# Patient Record
Sex: Male | Born: 1985 | Race: White | Hispanic: No | Marital: Single | Smoking: Current every day smoker
Health system: Southern US, Community
[De-identification: ages and names within clinical notes are randomized; demographics above are authoritative.]

## PROBLEM LIST (undated history)

## (undated) DIAGNOSIS — K219 Gastro-esophageal reflux disease without esophagitis: Secondary | ICD-10-CM

## (undated) HISTORY — PX: OTHER SURGICAL HISTORY: SHX169

---

## 2014-03-22 ENCOUNTER — Emergency Department (HOSPITAL_COMMUNITY): Payer: Self-pay

## 2014-03-22 ENCOUNTER — Encounter (HOSPITAL_COMMUNITY): Payer: Self-pay | Admitting: Emergency Medicine

## 2014-03-22 ENCOUNTER — Emergency Department (HOSPITAL_COMMUNITY)
Admission: EM | Admit: 2014-03-22 | Discharge: 2014-03-22 | Disposition: A | Discharge status: Home / Self Care | Payer: Self-pay | Attending: Emergency Medicine | Admitting: Emergency Medicine

## 2014-03-22 DIAGNOSIS — R635 Abnormal weight gain: Secondary | ICD-10-CM | POA: Insufficient documentation

## 2014-03-22 DIAGNOSIS — Z79899 Other long term (current) drug therapy: Secondary | ICD-10-CM | POA: Insufficient documentation

## 2014-03-22 DIAGNOSIS — F17200 Nicotine dependence, unspecified, uncomplicated: Secondary | ICD-10-CM | POA: Insufficient documentation

## 2014-03-22 DIAGNOSIS — F411 Generalized anxiety disorder: Secondary | ICD-10-CM | POA: Insufficient documentation

## 2014-03-22 DIAGNOSIS — R079 Chest pain, unspecified: Secondary | ICD-10-CM | POA: Insufficient documentation

## 2014-03-22 DIAGNOSIS — R42 Dizziness and giddiness: Secondary | ICD-10-CM | POA: Insufficient documentation

## 2014-03-22 DIAGNOSIS — R0602 Shortness of breath: Secondary | ICD-10-CM | POA: Insufficient documentation

## 2014-03-22 LAB — BASIC METABOLIC PANEL
ANION GAP: 14 (ref 5–15)
BUN: 13 mg/dL (ref 6–23)
CHLORIDE: 104 meq/L (ref 96–112)
CO2: 21 meq/L (ref 19–32)
Calcium: 9.4 mg/dL (ref 8.4–10.5)
Creatinine, Ser: 0.98 mg/dL (ref 0.50–1.35)
GFR calc Af Amer: >90 mL/min (ref >90)
GFR calc non Af Amer: >90 mL/min (ref >90)
Glucose, Bld: 122 mg/dL — ABNORMAL HIGH (ref 70–99)
Potassium: 4.1 mEq/L (ref 3.7–5.3)
Sodium: 139 mEq/L (ref 137–147)

## 2014-03-22 LAB — URINALYSIS, ROUTINE W REFLEX MICROSCOPIC
BILIRUBIN URINE: NEGATIVE
Glucose, UA: NEGATIVE mg/dL
HGB URINE DIPSTICK: NEGATIVE
Ketones, ur: NEGATIVE mg/dL
Leukocytes, UA: NEGATIVE
Nitrite: NEGATIVE
PROTEIN: NEGATIVE mg/dL
Specific Gravity, Urine: 1.025 (ref 1.005–1.030)
UROBILINOGEN UA: 0.2 mg/dL (ref 0.0–1.0)
pH: 8 (ref 5.0–8.0)

## 2014-03-22 LAB — CBC
HEMATOCRIT: 47.7 % (ref 39.0–52.0)
Hemoglobin: 16.8 g/dL (ref 13.0–17.0)
MCH: 31.6 pg (ref 26.0–34.0)
MCHC: 35.2 g/dL (ref 30.0–36.0)
MCV: 89.8 fL (ref 78.0–100.0)
Platelets: 216 10*3/uL (ref 150–400)
RBC: 5.31 MIL/uL (ref 4.22–5.81)
RDW: 12.9 % (ref 11.5–15.5)
WBC: 7.1 10*3/uL (ref 4.0–10.5)

## 2014-03-22 LAB — I-STAT TROPONIN, ED: Troponin i, poc: 0 ng/mL (ref 0.00–0.08)

## 2014-03-22 LAB — PRO B NATRIURETIC PEPTIDE: PRO B NATRI PEPTIDE: 25.7 pg/mL (ref 0–125)

## 2014-03-22 LAB — D-DIMER, QUANTITATIVE: D-Dimer, Quant: 0.34 ug/mL-FEU (ref 0.00–0.48)

## 2014-03-22 LAB — TSH: TSH: 1.27 u[IU]/mL (ref 0.350–4.500)

## 2014-03-22 NOTE — ED Provider Notes (Signed)
CSN: [id number]     Arrival date & time 03/22/14  0115 History   First MD Initiated Contact with Patient 03/22/14 0210     Chief Complaint  Patient presents with  . Chest Pain  . Palpitations     (Consider location/radiation/quality/duration/timing/severity/associated sxs/prior Treatment) HPI  This a 28 year old male who presents with multiple complaints. Patient reports that he had onset of anterior chest pain approximately 2 hours prior to arrival. He reports the sharp and worse with movement. Currently it is 0/10 but was 8/10. He reports associated shortness of breath and dizziness. Patient also reports lower extremity swelling that started on Friday. It has since improved. No known history of blood clots, recent travel, recent hospitalization. The recent fevers or illnesses. Patient does report weight gain over the last several months that has been unintentional. He also reports "tingling all over."  History reviewed. No pertinent past medical history. History reviewed. No pertinent past surgical history. No family history on file. History  Substance Use Topics  . Smoking status: Current Every Day Smoker -- 1.00 packs/day    Types: Cigarettes  . Smokeless tobacco: Not on file  . Alcohol Use: Yes     Comment: occ    Review of Systems  Constitutional: Negative.  Negative for fever.  Respiratory: Positive for chest tightness and shortness of breath.   Cardiovascular: Positive for chest pain and leg swelling.  Gastrointestinal: Negative.  Negative for nausea, vomiting, abdominal pain and diarrhea.  Genitourinary: Negative.  Negative for dysuria.  Musculoskeletal: Negative for back pain.  Skin: Negative for rash.  Neurological: Positive for dizziness. Negative for weakness, numbness and headaches.       Tingling  All other systems reviewed and are negative.     Allergies  Review of patient's allergies indicates no known allergies.  Home Medications   Prior to Admission  medications   Medication Sig Start Date End Date Taking? Authorizing Provider  calcium carbonate (TUMS - DOSED IN MG ELEMENTAL CALCIUM) 500 MG chewable tablet Chew 1 tablet by mouth daily.   Yes Historical Provider, MD  esomeprazole (NEXIUM) 40 MG capsule Take 40 mg by mouth daily at 12 noon.   Yes Historical Provider, MD  ibuprofen (ADVIL,MOTRIN) 200 MG tablet Take 200 mg by mouth every 6 (six) hours as needed for headache.   Yes Historical Provider, MD   BP 136/69  Pulse 75  Temp(Src) 99 F (37.2 C) (Oral)  Resp 16  Ht  (1.93 m)  Wt 265 lb (120.203 kg)  BMI 32.27 kg/m2  SpO2 96% Physical Exam  Nursing note and vitals reviewed. Constitutional: He is oriented to person, place, and time. He appears well-developed and well-nourished.  HENT:  Head: Normocephalic and atraumatic.  Eyes: Pupils are equal, round, and reactive to light.  Neck: Neck supple.  Cardiovascular: Normal rate, regular rhythm and normal heart sounds.   No murmur heard. Pulmonary/Chest: Effort normal and breath sounds normal. No respiratory distress. He has no wheezes. He exhibits no tenderness.  Abdominal: Soft. Bowel sounds are normal. There is no tenderness. There is no rebound.  Musculoskeletal: He exhibits no edema.  Lymphadenopathy:    He has no cervical adenopathy.  Neurological: He is alert and oriented to person, place, and time.  Skin: Skin is warm and dry.  Psychiatric:  anxious    ED Course  Procedures (including critical care time) Labs Review Labs Reviewed  BASIC METABOLIC PANEL - Abnormal; Notable for the following:    Glucose, Bld 122 (*)  All other components within normal limits  URINALYSIS, ROUTINE W REFLEX MICROSCOPIC - Abnormal; Notable for the following:    APPearance CLOUDY (*)    All other components within normal limits  CBC  PRO B NATRIURETIC PEPTIDE  TSH  D-DIMER, QUANTITATIVE  I-STAT TROPOININ, ED    Imaging Review Dg Chest 2 View  03/22/2014   CLINICAL DATA:   Chest pain and palpitations.  EXAM: CHEST  2 VIEW  COMPARISON:  None.  FINDINGS: Normal heart size and mediastinal contours. No acute infiltrate or edema. No effusion or pneumothorax. No acute osseous findings.  IMPRESSION: No active cardiopulmonary disease.   Electronically Signed   By: Tiburcio Pea M.D.   On: 03/22/2014 02:07     EKG Interpretation   Date/Time:  Wednesday March 22 2014 01:18:54 EDT Ventricular Rate:  106 PR Interval:  163 QRS Duration: 93 QT Interval:  346 QTC Calculation: 459 R Axis:   61 Text Interpretation:  Sinus tachycardia No prior for comparison Confirmed  by HORTON  MD, COURTNEY ([id number]) on 03/22/2014 4:31:14 AM      MDM   Final diagnoses:  Chest pain, unspecified chest pain type  Weight gain   Patient presents with multiple complaints including chest pain.  Some complaints appear chronic in nature including lower extremity edema and weight gain as well as "tingling all over." He is nontoxic-appearing on exam. Vital signs notable for pulse of 100 and blood pressure of 174/78.  Patient does not have a primary doctor. Patient's physical exam is benign.  Workup including troponin and d-dimer are reassuring. BNP is not elevated. No evidence of proteinuria. TSH is within normal limits.  Chest x-ray has no evidence of pneumothorax or pneumonia.  Patient has no evidence of PE. Doubt ACS.  Discussed results with the patient. Will set up with [facility]up.  After history, exam, and medical workup I feel the patient has been appropriately medically screened and is safe for discharge home. Pertinent diagnoses were discussed with the patient. Patient was given return precautions.      Shon Baton, MD 03/24/14 [phone]

## 2014-03-22 NOTE — ED Notes (Signed)
PT c/o chest pain that began approx 2 hrs ago; pt c/o substernal chest pain described as tightness; pt c/o palpitations off and on and feeling like his heart is beating out of his chest; pt states that he has had 15lbs weight gain in the last month for no reason; pt c/o bilateral foot swelling off and on over the last week; pt also c/o intermittent numbness and tingling throughout his body over the last few days; denies any numbness or tingling at present.

## 2014-03-22 NOTE — Discharge Instructions (Signed)

## 2014-08-11 ENCOUNTER — Emergency Department (HOSPITAL_COMMUNITY): Payer: Self-pay

## 2014-08-11 ENCOUNTER — Encounter (HOSPITAL_COMMUNITY): Payer: Self-pay | Admitting: *Deleted

## 2014-08-11 ENCOUNTER — Emergency Department (HOSPITAL_COMMUNITY)
Admission: EM | Admit: 2014-08-11 | Discharge: 2014-08-12 | Disposition: A | Discharge status: Home / Self Care | Payer: Self-pay | Attending: Emergency Medicine | Admitting: Emergency Medicine

## 2014-08-11 DIAGNOSIS — K219 Gastro-esophageal reflux disease without esophagitis: Secondary | ICD-10-CM | POA: Insufficient documentation

## 2014-08-11 DIAGNOSIS — Z79899 Other long term (current) drug therapy: Secondary | ICD-10-CM | POA: Insufficient documentation

## 2014-08-11 DIAGNOSIS — R109 Unspecified abdominal pain: Secondary | ICD-10-CM

## 2014-08-11 DIAGNOSIS — Z87891 Personal history of nicotine dependence: Secondary | ICD-10-CM | POA: Insufficient documentation

## 2014-08-11 DIAGNOSIS — R1011 Right upper quadrant pain: Secondary | ICD-10-CM | POA: Insufficient documentation

## 2014-08-11 HISTORY — DX: Gastro-esophageal reflux disease without esophagitis: K21.9

## 2014-08-11 LAB — CBC WITH DIFFERENTIAL/PLATELET
Basophils Absolute: 0 10*3/uL (ref 0.0–0.1)
Basophils Relative: 0 % (ref 0–1)
EOS PCT: 4 % (ref 0–5)
Eosinophils Absolute: 0.3 10*3/uL (ref 0.0–0.7)
HCT: 49.6 % (ref 39.0–52.0)
Hemoglobin: 17.2 g/dL — ABNORMAL HIGH (ref 13.0–17.0)
Lymphocytes Relative: 23 % (ref 12–46)
Lymphs Abs: 1.7 10*3/uL (ref 0.7–4.0)
MCH: 31.1 pg (ref 26.0–34.0)
MCHC: 34.7 g/dL (ref 30.0–36.0)
MCV: 89.7 fL (ref 78.0–100.0)
MONO ABS: 0.5 10*3/uL (ref 0.1–1.0)
Monocytes Relative: 6 % (ref 3–12)
NEUTROS ABS: 5 10*3/uL (ref 1.7–7.7)
Neutrophils Relative %: 67 % (ref 43–77)
Platelets: 213 10*3/uL (ref 150–400)
RBC: 5.53 MIL/uL (ref 4.22–5.81)
RDW: 12.8 % (ref 11.5–15.5)
WBC: 7.5 10*3/uL (ref 4.0–10.5)

## 2014-08-11 LAB — COMPREHENSIVE METABOLIC PANEL
ALK PHOS: 55 U/L (ref 39–117)
ALT: 56 U/L — ABNORMAL HIGH (ref 0–53)
AST: 30 U/L (ref 0–37)
Albumin: 4.6 g/dL (ref 3.5–5.2)
Anion gap: 9 (ref 5–15)
BILIRUBIN TOTAL: 0.9 mg/dL (ref 0.3–1.2)
BUN: 12 mg/dL (ref 6–23)
CHLORIDE: 106 mmol/L (ref 96–112)
CO2: 24 mmol/L (ref 19–32)
Calcium: 9.3 mg/dL (ref 8.4–10.5)
Creatinine, Ser: 0.91 mg/dL (ref 0.50–1.35)
GLUCOSE: 91 mg/dL (ref 70–99)
POTASSIUM: 4.1 mmol/L (ref 3.5–5.1)
SODIUM: 139 mmol/L (ref 135–145)
Total Protein: 7.9 g/dL (ref 6.0–8.3)

## 2014-08-11 LAB — LIPASE, BLOOD: Lipase: 23 U/L (ref 11–59)

## 2014-08-11 MED ORDER — GI COCKTAIL ~~LOC~~
30.0000 mL | Freq: Once | ORAL | Status: AC
  Administered 2014-08-11: 30 mL via ORAL
  Filled 2014-08-11: qty 30

## 2014-08-11 MED ORDER — PANTOPRAZOLE SODIUM 40 MG PO TBEC
40.0000 mg | DELAYED_RELEASE_TABLET | Freq: Once | ORAL | Status: AC
  Administered 2014-08-11: 40 mg via ORAL
  Filled 2014-08-11: qty 1

## 2014-08-11 NOTE — ED Notes (Signed)
US at bedside

## 2014-08-11 NOTE — ED Provider Notes (Signed)
CSN: [id number]     Arrival date & time 08/11/14  2000 History   First MD Initiated Contact with Patient 08/11/14 2108     Chief Complaint  Patient presents with  . Abdominal Pain     (Consider location/radiation/quality/duration/timing/severity/associated sxs/prior Treatment) HPI  Patient with PMH of GERD and former smoker (quit date 08/05/14; 1ppd) who presents today complaining of  sharp burning RUQ pain x 5d that has now become diffuse abdominal pain.  He states that today he felt like his intestines were "spasming" for approximately 30 seconds and is now experiencing diffuse abdominal pain.  The RUQ pain is described as constant.  He currently takes Nexium everyday and states that when he misses a dose his reflux is really terrible.  Associated symptoms include melena and periods of diaphoresis during sharp pains.  Denies N/V/D/C, fever, dysuria, SOB, CP, hematochezia.  He is not sure if the abdominal pain is associated with food, but does endorse >10 cups of coffee a day, loves [place] food, and denies recent EtOH use.  Past Medical History  Diagnosis Date  . GERD (gastroesophageal reflux disease) x 10 years    Past Surgical History  Procedure Laterality Date  . Eustacian tubes Bilateral     ears   History reviewed. No pertinent family history. History  Substance Use Topics  . Smoking status: Former Smoker -- 1.00 packs/day    Types: Cigarettes    Quit date: 08/05/2014  . Smokeless tobacco: Not on file  . Alcohol Use: Yes     Comment: occ/ not in the last 90 days    Review of Systems Negative unless otherwise stated in HPI.    Allergies  Review of patient's allergies indicates no known allergies.  Home Medications   Prior to Admission medications   Medication Sig Start Date End Date Taking? Authorizing Provider  calcium carbonate (TUMS - DOSED IN MG ELEMENTAL CALCIUM) 500 MG chewable tablet Chew 1 tablet by mouth daily as needed for indigestion (indigestion).    Yes  Historical Provider, MD  esomeprazole (NEXIUM) 40 MG capsule Take 40 mg by mouth daily at 12 noon.   Yes Historical Provider, MD  ibuprofen (ADVIL,MOTRIN) 200 MG tablet Take 400 mg by mouth every 6 (six) hours as needed for headache or moderate pain (pain).    Yes Historical Provider, MD   BP 136/84 mmHg  Pulse 74  Temp(Src) 97.9 F (36.6 C)  Resp 20  Ht  (1.93 m)  Wt 290 lb (131.543 kg)  BMI 35.31 kg/m2  SpO2 100% Physical Exam  Constitutional: He is oriented to person, place, and time. He appears well-developed and well-nourished. No distress.  HENT:  Head: Normocephalic and atraumatic.  Eyes: Conjunctivae and EOM are normal. Pupils are equal, round, and reactive to light.  Neck: Normal range of motion. Neck supple.  Cardiovascular: Normal rate, regular rhythm and normal heart sounds.   No murmur heard. Pulmonary/Chest: Effort normal and breath sounds normal. He has no wheezes. He has no rales.  Abdominal: Soft. Normal appearance and bowel sounds are normal. He exhibits no distension. There is tenderness in the right upper quadrant. There is no rigidity, no rebound, no guarding, no tenderness at McBurney's point and negative Murphy's sign.  Neurological: He is alert and oriented to person, place, and time. He exhibits normal muscle tone. Coordination normal.  Skin: Skin is warm and dry. No rash noted. He is not diaphoretic.  Nursing note and vitals reviewed.   ED Course  Procedures (  including critical care time) Labs Review Labs Reviewed  CBC WITH DIFFERENTIAL/PLATELET - Abnormal; Notable for the following:    Hemoglobin 17.2 (*)    All other components within normal limits  COMPREHENSIVE METABOLIC PANEL - Abnormal; Notable for the following:    ALT 56 (*)    All other components within normal limits  LIPASE, BLOOD  POC OCCULT BLOOD, ED    Imaging Review [place] Abdomen Complete  08/11/2014   CLINICAL DATA:  Abdominal pain.   Initial encounter.  EXAM: ULTRASOUND ABDOMEN  COMPLETE  COMPARISON:  None.  FINDINGS: Gallbladder: No gallstones or wall thickening visualized. No sonographic Murphy sign noted.  Common bile duct: Diameter: Could not be visualized due to excessive bowel gas.  Liver: No focal lesion identified. Increased echogenicity. No intrahepatic biliary duct dilatation.  IVC: No abnormality visualized.  Pancreas: Visualized portion unremarkable.  Spleen: Size and appearance within normal limits.  Right Kidney: Length: 13.5 cm. Echogenicity within normal limits. No mass or hydronephrosis visualized.  Left Kidney: Length: 14.1 cm. Echogenicity within normal limits. No mass or hydronephrosis visualized.  Abdominal aorta: No aneurysm visualized.  Other findings: None.  IMPRESSION: Hepatic steatosis. Negative gallbladder. No strong evidence of biliary obstruction.   Electronically Signed   By: Odessa FlemingH  Hall M.D.   On: 08/11/2014 23:31   Ct Abdomen Pelvis W Contrast  08/12/2014   CLINICAL DATA:  29 year old male 5 days of abdominal pain. Right-sided pain. Initial encounter.  EXAM: CT ABDOMEN AND PELVIS WITH CONTRAST  TECHNIQUE: Multidetector CT imaging of the abdomen and pelvis was performed using the standard protocol following bolus administration of intravenous contrast.  CONTRAST:  125mL OMNIPAQUE IOHEXOL 300 MG/ML  SOLN  COMPARISON:  Abdomen ultrasound 08/11/2014  FINDINGS: Negative lung bases. Degenerative changes lower lumbar spine. Congenital short pedicle distant. No acute osseous abnormality identified.  No pelvic free fluid. No large bowel inflammation. Normal appendix. Node I was held up. Negative stomach and duodenum.  Decreased density in the liver. No intra or extrahepatic biliary ductal dilatation. Negative gallbladder, spleen, pancreas, adrenal glands, kidneys, in abdominal free of could.  Portal venous system and major arterial structures appear patent. No lymphadenopathy. Unremarkable bladder.  IMPRESSION: Normal appendix.  Negative CT Abdomen and Pelvis.    Electronically Signed   By: Odessa FlemingH  Hall M.D.   On: 08/12/2014 00:38     MDM   Final diagnoses:  None    Patient presents complaining of 5d of burning abdominal pain with PMH significant for GERD that is treated with Nexium.  He recently quit smoking (08/05/2013) and smoked 1 ppd.  Drinks >10 cups of coffee/day and likes spicy foods.  Labs are unremarkable.  Given GI cocktail in the ED with improvement of symptoms.  Prescribed sucralfate and ranitidine and discharged with a GI followup.  May return as needed or if symptoms worsen.     Carlyle DollyChristopher W Mashal Slavick, PA-C 08/12/14 2213  Toy BakerAnthony T Allen, MD 08/13/14 [phone]

## 2014-08-11 NOTE — ED Notes (Signed)
Pt refused ordered POC occult.

## 2014-08-11 NOTE — ED Notes (Signed)
Pt came into the ED today with c/o abdominal pain x 5days. Pain was initially burning/sharp wright side abd but now pain/burning is all over. N/V denied

## 2014-08-12 MED ORDER — HYDROCODONE-ACETAMINOPHEN 5-325 MG PO TABS: 1.0000 | ORAL_TABLET | Freq: Four times a day (QID) | ORAL | Status: AC | PRN

## 2014-08-12 MED ORDER — SUCRALFATE 1 G PO TABS: 1.0000 g | ORAL_TABLET | Freq: Three times a day (TID) | ORAL | Status: DC

## 2014-08-12 MED ORDER — FAMOTIDINE 20 MG PO TABS: 20.0000 mg | ORAL_TABLET | Freq: Two times a day (BID) | ORAL | Status: AC

## 2014-08-12 MED ORDER — IOHEXOL 300 MG/ML  SOLN
125.0000 mL | Freq: Once | INTRAMUSCULAR | Status: AC | PRN
  Administered 2014-08-12: 125 mL via INTRAVENOUS

## 2014-08-12 NOTE — Discharge Instructions (Signed)
Return here as needed.  Follow-up with GI, Dr. provided.  Slowly increase your fluid intake

## 2014-09-21 ENCOUNTER — Emergency Department (HOSPITAL_COMMUNITY)
Admission: EM | Admit: 2014-09-21 | Discharge: 2014-09-21 | Disposition: A | Discharge status: Home / Self Care | Payer: Self-pay | Attending: Emergency Medicine | Admitting: Emergency Medicine

## 2014-09-21 ENCOUNTER — Emergency Department (HOSPITAL_COMMUNITY): Payer: Self-pay

## 2014-09-21 ENCOUNTER — Encounter (HOSPITAL_COMMUNITY): Payer: Self-pay

## 2014-09-21 DIAGNOSIS — Y9301 Activity, walking, marching and hiking: Secondary | ICD-10-CM | POA: Insufficient documentation

## 2014-09-21 DIAGNOSIS — X58XXXA Exposure to other specified factors, initial encounter: Secondary | ICD-10-CM | POA: Insufficient documentation

## 2014-09-21 DIAGNOSIS — Z87891 Personal history of nicotine dependence: Secondary | ICD-10-CM | POA: Insufficient documentation

## 2014-09-21 DIAGNOSIS — R609 Edema, unspecified: Secondary | ICD-10-CM | POA: Insufficient documentation

## 2014-09-21 DIAGNOSIS — Z79899 Other long term (current) drug therapy: Secondary | ICD-10-CM | POA: Insufficient documentation

## 2014-09-21 DIAGNOSIS — Y9289 Other specified places as the place of occurrence of the external cause: Secondary | ICD-10-CM | POA: Insufficient documentation

## 2014-09-21 DIAGNOSIS — S8992XA Unspecified injury of left lower leg, initial encounter: Secondary | ICD-10-CM | POA: Insufficient documentation

## 2014-09-21 DIAGNOSIS — Y998 Other external cause status: Secondary | ICD-10-CM | POA: Insufficient documentation

## 2014-09-21 DIAGNOSIS — K219 Gastro-esophageal reflux disease without esophagitis: Secondary | ICD-10-CM | POA: Insufficient documentation

## 2014-09-21 MED ORDER — SUCRALFATE 1 G PO TABS: 1.0000 g | ORAL_TABLET | Freq: Three times a day (TID) | ORAL | Status: AC

## 2014-09-21 MED ORDER — OXYCODONE HCL ER 10 MG PO T12A
10.0000 mg | EXTENDED_RELEASE_TABLET | Freq: Two times a day (BID) | ORAL | Status: DC
  Administered 2014-09-21 (×2): 10 mg via ORAL
  Filled 2014-09-21: qty 1

## 2014-09-21 MED ORDER — OXYCODONE HCL 5 MG PO TABS: 5.0000 mg | ORAL_TABLET | ORAL | Status: AC | PRN

## 2014-09-21 NOTE — ED Notes (Signed)
Patient reports he felt his left knee pop four days ago.  He used ice for the first two days after injury.  Unable to bear weight.

## 2014-09-21 NOTE — ED Provider Notes (Signed)
CSN: [id number]     Arrival date & time 09/21/14  1933 History  This chart was scribed for non-physician practitioner, Emilia BeckKaitlyn Lynelle Weiler, PA-C working with Raeford RazorStephen Kohut, MD by Gwenyth Oberatherine Macek, ED scribe. This patient was seen in room WTR8/WTR8 and the patient's care was started at 8:43 PM   Chief Complaint  Patient presents with  . Knee Injury   The history is provided by the patient. No language interpreter was used.    HPI Comments: Anthony Harrison is a 29 y.o. male who presents to the Emergency Department complaining of constant, moderate, anterior left knee pain that started 4 days ago. He states swelling of his left knee and difficulty walking as associated symptoms. Pt notes his pain becomes worse with bearing weight. He has tried applying ice to his knee and taking Ibuprofen with no relief. Pt reports that he was walking around when he twisted his knee and heard a pop. He notes an adverse reaction to acetaminophen. Pt denies posterior knee pain, numbness and tingling as associated symptoms.  Past Medical History  Diagnosis Date  . GERD (gastroesophageal reflux disease) x 10 years    Past Surgical History  Procedure Laterality Date  . Eustacian tubes Bilateral     ears   History reviewed. No pertinent family history. History  Substance Use Topics  . Smoking status: Former Smoker -- 1.00 packs/day    Types: Cigarettes    Quit date: 08/05/2014  . Smokeless tobacco: Not on file  . Alcohol Use: Yes     Comment: occ/ not in the last 90 days    Review of Systems  Musculoskeletal: Positive for joint swelling, arthralgias and gait problem.  Skin: Negative for wound.  Neurological: Negative for weakness and numbness.   Allergies  Review of patient's allergies indicates no known allergies.  Home Medications   Prior to Admission medications   Medication Sig Start Date End Date Taking? Authorizing Provider  calcium carbonate (TUMS - DOSED IN MG ELEMENTAL CALCIUM) 500 MG  chewable tablet Chew 1 tablet by mouth daily as needed for indigestion (indigestion).     Historical Provider, MD  esomeprazole (NEXIUM) 40 MG capsule Take 40 mg by mouth daily at 12 noon.    Historical Provider, MD  famotidine (PEPCID) 20 MG tablet Take 1 tablet (20 mg total) by mouth 2 (two) times daily. 08/12/14   Charlestine Nighthristopher Lawyer, PA-C  HYDROcodone-acetaminophen (NORCO/VICODIN) 5-325 MG per tablet Take 1 tablet by mouth every 6 (six) hours as needed for moderate pain. 08/12/14   Charlestine Nighthristopher Lawyer, PA-C  ibuprofen (ADVIL,MOTRIN) 200 MG tablet Take 400 mg by mouth every 6 (six) hours as needed for headache or moderate pain (pain).     Historical Provider, MD  sucralfate (CARAFATE) 1 G tablet Take 1 tablet (1 g total) by mouth 4 (four) times daily -  with meals and at bedtime. 08/12/14   Christopher Lawyer, PA-C   BP 124/94 mmHg  Pulse 73  Temp(Src) 98.1 F (36.7 C) (Oral)  Resp 20  SpO2 98% Physical Exam  Constitutional: He appears well-developed and well-nourished. No distress.  HENT:  Head: Normocephalic and atraumatic.  Eyes: Conjunctivae and EOM are normal.  Neck: Neck supple. No tracheal deviation present.  Cardiovascular: Normal rate.   Pulmonary/Chest: Effort normal. No respiratory distress.  Musculoskeletal: He exhibits edema and tenderness.  Generalized edema of left knee; anterior TTP over the patella; limited ROM due to swelling and severe pain; no obvious deformity; no calf TTP  Skin: Skin is warm  and dry.  Psychiatric: He has a normal mood and affect. His behavior is normal.  Nursing note and vitals reviewed.   ED Course  Procedures   DIAGNOSTIC STUDIES: Oxygen Saturation is 98% on RA, normal by my interpretation.    COORDINATION OF CARE: 8:51 PM Discussed x-ray results and treatment plan with pt which includes RICE method and follow-up with an orthopedist for MRI. He agreed to plan.  SPLINT APPLICATION Date/Time: 9:03 PM Authorized by: Emilia Beck Consent: Verbal consent obtained. Risks and benefits: risks, benefits and alternatives were discussed Consent given by: patient Splint applied by: orthopedic technician Location details: left knee Splint type: knee immobilizer Supplies used: knee immobilizer Post-procedure: The splinted body part was neurovascularly unchanged following the procedure. Patient tolerance: Patient tolerated the procedure well with no immediate complications.     Labs Review Labs Reviewed - No data to display  Imaging Review Dg Knee Complete 4 Views Left  09/21/2014   CLINICAL DATA:  Injury to left knee, with pop. Diffuse left knee pain. Initial encounter.  EXAM: LEFT KNEE - COMPLETE 4+ VIEW  COMPARISON:  None.  FINDINGS: Tiny osseous fragments are seen projecting over the superior aspect of Hoffa's fat pad, possibly reflecting an avulsion injury from the inferior articular surface of the patella. A large knee joint effusion is noted.  The joint spaces are preserved. No significant degenerative change is seen; the patellofemoral joint is grossly unremarkable in appearance. A fabella is noted.  IMPRESSION: Tiny osseous fragments projecting over the superior aspect of Hoffa's fat pad, possibly reflecting an avulsion injury from the articular surface of the patella. Large knee joint effusion noted. MRI of the knee would be helpful for further evaluation, to assess for internal derangement.   Electronically Signed   By: Roanna Raider M.D.   On: 09/21/2014 20:35     EKG Interpretation None      MDM   Final diagnoses:  Left knee injury, initial encounter    9:03 PM Patient's xray shows avulsion injury of patella and large joint effusion. Patient will have knee immobilizer, crutches and follow up with Orthopedics. No neurovascular compromise. Patient instructed to rest, ice, and elevate.   I personally performed the services described in this documentation, which was scribed in my presence. The  recorded information has been reviewed and is accurate.    [address] [place], PA-C 09/22/14 0125  Raeford Razor, MD 09/23/14 [phone]

## 2014-09-21 NOTE — Discharge Instructions (Signed)
Take oxycodone as needed for pain. Rest, ice, and elevate your knee. Follow up with Dr. Magnus IvanBlackman for further evaluation.

## 2014-09-27 ENCOUNTER — Other Ambulatory Visit (HOSPITAL_COMMUNITY): Payer: Self-pay | Admitting: Orthopaedic Surgery

## 2014-09-27 DIAGNOSIS — M25562 Pain in left knee: Secondary | ICD-10-CM

## 2014-10-05 ENCOUNTER — Ambulatory Visit (HOSPITAL_COMMUNITY)
Admission: RE | Admit: 2014-10-05 | Discharge: 2014-10-05 | Disposition: A | Discharge status: Home / Self Care | Payer: Self-pay | Source: Ambulatory Visit | Attending: Orthopaedic Surgery | Admitting: Orthopaedic Surgery

## 2014-10-05 DIAGNOSIS — S76112A Strain of left quadriceps muscle, fascia and tendon, initial encounter: Secondary | ICD-10-CM | POA: Insufficient documentation

## 2014-10-05 DIAGNOSIS — M67962 Unspecified disorder of synovium and tendon, left lower leg: Secondary | ICD-10-CM | POA: Insufficient documentation

## 2014-10-05 DIAGNOSIS — X58XXXA Exposure to other specified factors, initial encounter: Secondary | ICD-10-CM | POA: Insufficient documentation

## 2014-10-05 DIAGNOSIS — S82002A Unspecified fracture of left patella, initial encounter for closed fracture: Secondary | ICD-10-CM | POA: Insufficient documentation

## 2014-10-05 DIAGNOSIS — R937 Abnormal findings on diagnostic imaging of other parts of musculoskeletal system: Secondary | ICD-10-CM | POA: Insufficient documentation

## 2014-10-05 DIAGNOSIS — M25562 Pain in left knee: Secondary | ICD-10-CM

## 2015-04-24 ENCOUNTER — Emergency Department (HOSPITAL_COMMUNITY)
Admission: EM | Admit: 2015-04-24 | Discharge: 2015-04-24 | Disposition: A | Discharge status: Home / Self Care | Payer: No Typology Code available for payment source | Attending: Emergency Medicine | Admitting: Emergency Medicine

## 2015-04-24 ENCOUNTER — Encounter (HOSPITAL_COMMUNITY): Payer: Self-pay | Admitting: Emergency Medicine

## 2015-04-24 ENCOUNTER — Emergency Department (HOSPITAL_COMMUNITY): Payer: No Typology Code available for payment source

## 2015-04-24 DIAGNOSIS — Y92410 Unspecified street and highway as the place of occurrence of the external cause: Secondary | ICD-10-CM | POA: Insufficient documentation

## 2015-04-24 DIAGNOSIS — S3992XA Unspecified injury of lower back, initial encounter: Secondary | ICD-10-CM | POA: Insufficient documentation

## 2015-04-24 DIAGNOSIS — R451 Restlessness and agitation: Secondary | ICD-10-CM | POA: Diagnosis not present

## 2015-04-24 DIAGNOSIS — Z79899 Other long term (current) drug therapy: Secondary | ICD-10-CM | POA: Insufficient documentation

## 2015-04-24 DIAGNOSIS — R52 Pain, unspecified: Secondary | ICD-10-CM

## 2015-04-24 DIAGNOSIS — Y998 Other external cause status: Secondary | ICD-10-CM | POA: Insufficient documentation

## 2015-04-24 DIAGNOSIS — S4992XA Unspecified injury of left shoulder and upper arm, initial encounter: Secondary | ICD-10-CM | POA: Insufficient documentation

## 2015-04-24 DIAGNOSIS — S199XXA Unspecified injury of neck, initial encounter: Secondary | ICD-10-CM | POA: Diagnosis not present

## 2015-04-24 DIAGNOSIS — Y9389 Activity, other specified: Secondary | ICD-10-CM | POA: Insufficient documentation

## 2015-04-24 DIAGNOSIS — K219 Gastro-esophageal reflux disease without esophagitis: Secondary | ICD-10-CM | POA: Diagnosis not present

## 2015-04-24 DIAGNOSIS — S8992XA Unspecified injury of left lower leg, initial encounter: Secondary | ICD-10-CM | POA: Insufficient documentation

## 2015-04-24 MED ORDER — OXYCODONE-ACETAMINOPHEN 5-325 MG PO TABS: 1.0000 | ORAL_TABLET | Freq: Once | ORAL | Status: DC

## 2015-04-24 MED ORDER — IBUPROFEN 800 MG PO TABS
800.0000 mg | ORAL_TABLET | Freq: Once | ORAL | Status: DC
  Filled 2015-04-24: qty 1

## 2015-04-24 MED ORDER — MELOXICAM 7.5 MG PO TABS: 7.5000 mg | ORAL_TABLET | Freq: Every day | ORAL | Status: AC

## 2015-04-24 NOTE — ED Notes (Signed)
Pt states "I can't take anything with tylenol or ibuprofen". Reinforced PA-C explanation on why patient was not order narcotic medication. Patient and visitor requesting to speak with EDP. PA-C made aware of same.

## 2015-04-24 NOTE — ED Notes (Signed)
Patient ambulating with steady gait, A&O x4, clear speech, in NAD.
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
## 2015-05-07 ENCOUNTER — Encounter (HOSPITAL_COMMUNITY): Payer: Self-pay | Admitting: *Deleted

## 2015-05-07 ENCOUNTER — Emergency Department (HOSPITAL_COMMUNITY)
Admission: EM | Admit: 2015-05-07 | Discharge: 2015-05-07 | Discharge status: Against Medical Advice | Payer: No Typology Code available for payment source | Attending: Emergency Medicine | Admitting: Emergency Medicine

## 2015-05-07 DIAGNOSIS — Z87828 Personal history of other (healed) physical injury and trauma: Secondary | ICD-10-CM | POA: Insufficient documentation

## 2015-05-07 DIAGNOSIS — Z720 Tobacco use: Secondary | ICD-10-CM | POA: Diagnosis not present

## 2015-05-07 DIAGNOSIS — M549 Dorsalgia, unspecified: Secondary | ICD-10-CM | POA: Diagnosis present

## 2015-05-07 DIAGNOSIS — R110 Nausea: Secondary | ICD-10-CM | POA: Insufficient documentation

## 2015-05-07 MED ORDER — ONDANSETRON 4 MG PO TBDP
4.0000 mg | ORAL_TABLET | Freq: Once | ORAL | Status: AC | PRN
  Administered 2015-05-07: 4 mg via ORAL

## 2015-05-07 MED ORDER — ONDANSETRON 4 MG PO TBDP
ORAL_TABLET | ORAL | Status: AC
  Filled 2015-05-07: qty 1
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
